# Patient Record
Sex: Male | Born: 1985 | Race: White | Hispanic: No | Smoking: Current every day smoker
Health system: Southern US, Community
[De-identification: ages and names within clinical notes are randomized; demographics above are authoritative.]

---

## 2015-08-05 ENCOUNTER — Emergency Department (HOSPITAL_COMMUNITY): Payer: Medicaid Other

## 2015-08-05 ENCOUNTER — Encounter (HOSPITAL_COMMUNITY): Payer: Self-pay | Admitting: Emergency Medicine

## 2015-08-05 ENCOUNTER — Inpatient Hospital Stay (HOSPITAL_COMMUNITY)
Admission: EM | Admit: 2015-08-05 | Discharge: 2015-09-05 | DRG: 082 | Disposition: E | Payer: Medicaid Other | Attending: General Surgery | Admitting: General Surgery

## 2015-08-05 DIAGNOSIS — J9600 Acute respiratory failure, unspecified whether with hypoxia or hypercapnia: Secondary | ICD-10-CM | POA: Diagnosis present

## 2015-08-05 DIAGNOSIS — Z66 Do not resuscitate: Secondary | ICD-10-CM | POA: Diagnosis not present

## 2015-08-05 DIAGNOSIS — R402312 Coma scale, best motor response, none, at arrival to emergency department: Secondary | ICD-10-CM | POA: Diagnosis present

## 2015-08-05 DIAGNOSIS — R402112 Coma scale, eyes open, never, at arrival to emergency department: Secondary | ICD-10-CM | POA: Diagnosis present

## 2015-08-05 DIAGNOSIS — X749XXA Intentional self-harm by unspecified firearm discharge, initial encounter: Secondary | ICD-10-CM | POA: Diagnosis not present

## 2015-08-05 DIAGNOSIS — Z515 Encounter for palliative care: Secondary | ICD-10-CM | POA: Diagnosis not present

## 2015-08-05 DIAGNOSIS — R402212 Coma scale, best verbal response, none, at arrival to emergency department: Secondary | ICD-10-CM | POA: Diagnosis present

## 2015-08-05 DIAGNOSIS — S069XAA Unspecified intracranial injury with loss of consciousness status unknown, initial encounter: Secondary | ICD-10-CM | POA: Diagnosis present

## 2015-08-05 DIAGNOSIS — W3400XA Accidental discharge from unspecified firearms or gun, initial encounter: Secondary | ICD-10-CM

## 2015-08-05 DIAGNOSIS — F17210 Nicotine dependence, cigarettes, uncomplicated: Secondary | ICD-10-CM | POA: Diagnosis present

## 2015-08-05 DIAGNOSIS — S069X9A Unspecified intracranial injury with loss of consciousness of unspecified duration, initial encounter: Principal | ICD-10-CM | POA: Diagnosis present

## 2015-08-05 DIAGNOSIS — Y249XXA Unspecified firearm discharge, undetermined intent, initial encounter: Secondary | ICD-10-CM

## 2015-08-05 DIAGNOSIS — R000 Tachycardia, unspecified: Secondary | ICD-10-CM | POA: Diagnosis present

## 2015-08-05 LAB — I-STAT ARTERIAL BLOOD GAS, ED
ACID-BASE DEFICIT: 5 mmol/L — AB (ref 0.0–2.0)
BICARBONATE: 22.3 meq/L (ref 20.0–24.0)
O2 SAT: 100 %
PH ART: 7.271 — AB (ref 7.350–7.450)
PO2 ART: 440 mmHg — AB (ref 80.0–100.0)
TCO2: 24 mmol/L (ref 0–100)
pCO2 arterial: 48.6 mmHg — ABNORMAL HIGH (ref 35.0–45.0)

## 2015-08-05 LAB — PREPARE FRESH FROZEN PLASMA
UNIT DIVISION: 0
Unit division: 0

## 2015-08-05 LAB — PROTIME-INR
INR: 1.17 (ref 0.00–1.49)
PROTHROMBIN TIME: 15.1 s (ref 11.6–15.2)

## 2015-08-05 LAB — COMPREHENSIVE METABOLIC PANEL
ALK PHOS: 68 U/L (ref 38–126)
ALT: 24 U/L (ref 17–63)
ANION GAP: 11 (ref 5–15)
AST: 41 U/L (ref 15–41)
Albumin: 3.6 g/dL (ref 3.5–5.0)
BILIRUBIN TOTAL: 0.4 mg/dL (ref 0.3–1.2)
BUN: 5 mg/dL — ABNORMAL LOW (ref 6–20)
CALCIUM: 7.8 mg/dL — AB (ref 8.9–10.3)
CO2: 21 mmol/L — AB (ref 22–32)
CREATININE: 0.93 mg/dL (ref 0.61–1.24)
Chloride: 103 mmol/L (ref 101–111)
GFR calc non Af Amer: >60 mL/min (ref >60)
GLUCOSE: 172 mg/dL — AB (ref 65–99)
Potassium: 3.5 mmol/L (ref 3.5–5.1)
Sodium: 135 mmol/L (ref 135–145)
TOTAL PROTEIN: 6.8 g/dL (ref 6.5–8.1)

## 2015-08-05 LAB — CBC
HCT: 41.7 % (ref 39.0–52.0)
Hemoglobin: 14.4 g/dL (ref 13.0–17.0)
MCH: 33 pg (ref 26.0–34.0)
MCHC: 34.5 g/dL (ref 30.0–36.0)
MCV: 95.6 fL (ref 78.0–100.0)
Platelets: 309 10*3/uL (ref 150–400)
RBC: 4.36 MIL/uL (ref 4.22–5.81)
RDW: 12.5 % (ref 11.5–15.5)
WBC: 13.2 10*3/uL — ABNORMAL HIGH (ref 4.0–10.5)

## 2015-08-05 LAB — LACTIC ACID, PLASMA: Lactic Acid, Venous: 3.5 mmol/L (ref 0.5–2.0)

## 2015-08-05 LAB — ETHANOL: ALCOHOL ETHYL (B): 198 mg/dL — AB (ref <5)

## 2015-08-05 LAB — CDS SEROLOGY

## 2015-08-05 LAB — ABO/RH: ABO/RH(D): A POS

## 2015-08-05 MED ORDER — ROCURONIUM BROMIDE 50 MG/5ML IV SOLN
INTRAVENOUS | Status: AC
  Filled 2015-08-05: qty 2

## 2015-08-05 MED ORDER — SODIUM CHLORIDE 0.9 % IV SOLN
INTRAVENOUS | Status: AC | PRN
  Administered 2015-08-05 (×2): 1000 mL via INTRAVENOUS

## 2015-08-05 MED ORDER — ANTISEPTIC ORAL RINSE SOLUTION (CORINZ): 7.0000 mL | Freq: Four times a day (QID) | OROMUCOSAL | Status: DC

## 2015-08-05 MED ORDER — POTASSIUM CHLORIDE IN NACL 20-0.9 MEQ/L-% IV SOLN
INTRAVENOUS | Status: DC
  Administered 2015-08-05: via INTRAVENOUS
  Filled 2015-08-05 (×3): qty 1000

## 2015-08-05 MED ORDER — ETOMIDATE 2 MG/ML IV SOLN
INTRAVENOUS | Status: AC
  Filled 2015-08-05: qty 20

## 2015-08-05 MED ORDER — LIDOCAINE HCL (CARDIAC) 20 MG/ML IV SOLN
INTRAVENOUS | Status: AC
  Filled 2015-08-05: qty 5

## 2015-08-05 MED ORDER — SUCCINYLCHOLINE CHLORIDE 20 MG/ML IJ SOLN
INTRAMUSCULAR | Status: AC
  Filled 2015-08-05: qty 1

## 2015-08-05 MED ORDER — CHLORHEXIDINE GLUCONATE 0.12% ORAL RINSE (MEDLINE KIT)
15.0000 mL | Freq: Two times a day (BID) | OROMUCOSAL | Status: DC
  Administered 2015-08-06: 15 mL via OROMUCOSAL

## 2015-08-05 NOTE — ED Notes (Signed)
Carollee HerterShannon from CDS [place] way [place].  Cell phone is [phone].
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
## 2015-08-05 NOTE — ED Notes (Signed)
Patient is on no sedation or pressers at this time.  Awaiting Neuro consult.

## 2015-08-05 NOTE — Progress Notes (Signed)
Called to patient beside by RN patient desat to 86%. FIO2 increased to 100%. RR increased to 20 per MD verbal order. RN at beside.

## 2015-08-05 NOTE — ED Notes (Signed)
Increased FiO2 back to 100 at this time.
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
## 2015-08-05 NOTE — ED Notes (Signed)
Patient returned from CT

## 2015-08-05 NOTE — ED Notes (Signed)
Shannon from [facility] arrived at bedside.
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
## 2015-08-05 NOTE — ED Notes (Signed)
Organ procurement team notified by Consulting civil engineerCharge RN.
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
## 2015-08-05 NOTE — ED Notes (Signed)
To CT with RN and EMT

## 2015-08-05 NOTE — Progress Notes (Signed)
RT assisted with intubation. Bilateral breath sounds present. CO2 dector has color change. CXR and ABG pending. RT will continue to monitor.

## 2015-08-05 NOTE — ED Notes (Signed)
Patient desaturated to high 80's and brady down to 50's.  Dr Gwendolyn GrantWalden, Dr Sibyl Parrhapman and Dr Janee Mornhompson notified.
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
## 2015-08-06 LAB — TYPE AND SCREEN
ABO/RH(D): A POS
Antibody Screen: NEGATIVE
UNIT DIVISION: 0
UNIT DIVISION: 0

## 2015-08-06 LAB — BLOOD PRODUCT ORDER (VERBAL) VERIFICATION

## 2015-08-06 LAB — BLOOD GAS, ARTERIAL
Acid-base deficit: 2.9 mmol/L — ABNORMAL HIGH (ref 0.0–2.0)
BICARBONATE: 20.4 meq/L (ref 20.0–24.0)
Drawn by: 129711
FIO2: 0.4
MECHVT: 620 mL
O2 Saturation: 95.7 %
PATIENT TEMPERATURE: 102.2
PCO2 ART: 32.4 mmHg — AB (ref 35.0–45.0)
PEEP/CPAP: 5 cmH2O
PO2 ART: 82.9 mmHg (ref 80.0–100.0)
RATE: 20 resp/min
TCO2: 21.3 mmol/L (ref 0–100)
pH, Arterial: 7.426 (ref 7.350–7.450)

## 2015-08-06 LAB — CBC
HEMATOCRIT: 41.9 % (ref 39.0–52.0)
HEMOGLOBIN: 14.3 g/dL (ref 13.0–17.0)
MCH: 32.8 pg (ref 26.0–34.0)
MCHC: 34.1 g/dL (ref 30.0–36.0)
MCV: 96.1 fL (ref 78.0–100.0)
Platelets: 284 10*3/uL (ref 150–400)
RBC: 4.36 MIL/uL (ref 4.22–5.81)
RDW: 12.8 % (ref 11.5–15.5)
WBC: 42.2 10*3/uL — ABNORMAL HIGH (ref 4.0–10.5)

## 2015-08-06 LAB — BASIC METABOLIC PANEL
ANION GAP: 17 — AB (ref 5–15)
BUN: <5 mg/dL — ABNORMAL LOW (ref 6–20)
CHLORIDE: 105 mmol/L (ref 101–111)
CO2: 17 mmol/L — AB (ref 22–32)
CREATININE: 0.87 mg/dL (ref 0.61–1.24)
Calcium: 8.1 mg/dL — ABNORMAL LOW (ref 8.9–10.3)
GFR calc non Af Amer: >60 mL/min (ref >60)
Glucose, Bld: 166 mg/dL — ABNORMAL HIGH (ref 65–99)
POTASSIUM: 3.4 mmol/L — AB (ref 3.5–5.1)
Sodium: 139 mmol/L (ref 135–145)

## 2015-08-06 LAB — MRSA PCR SCREENING: MRSA by PCR: NEGATIVE

## 2015-08-06 MED ORDER — ANTISEPTIC ORAL RINSE SOLUTION (CORINZ): 7.0000 mL | Freq: Four times a day (QID) | OROMUCOSAL | Status: DC

## 2015-08-06 MED ORDER — ACETAMINOPHEN 650 MG RE SUPP
650.0000 mg | RECTAL | Status: DC | PRN
  Filled 2015-08-06: qty 1

## 2015-08-06 MED ORDER — ANTISEPTIC ORAL RINSE SOLUTION (CORINZ): 7.0000 mL | OROMUCOSAL | Status: DC

## 2015-08-06 MED ORDER — ANTISEPTIC ORAL RINSE SOLUTION (CORINZ)
7.0000 mL | OROMUCOSAL | Status: DC
  Administered 2015-08-06 (×3): 7 mL via OROMUCOSAL

## 2015-08-06 MED ORDER — CHLORHEXIDINE GLUCONATE 0.12% ORAL RINSE (MEDLINE KIT): 15.0000 mL | Freq: Two times a day (BID) | OROMUCOSAL | Status: DC

## 2015-08-06 MED ORDER — DEXTROSE 5 % IV SOLN: INTRAVENOUS | Status: DC

## 2015-08-06 MED ORDER — ALBUMIN HUMAN 5 % IV SOLN
INTRAVENOUS | Status: AC
  Filled 2015-08-06: qty 500

## 2015-08-06 MED ORDER — MORPHINE SULFATE 25 MG/ML IV SOLN
10.0000 mg/h | INTRAVENOUS | Status: DC
  Administered 2015-08-06 (×2): 10 mg/h via INTRAVENOUS
  Filled 2015-08-06: qty 10

## 2015-08-06 MED ORDER — MORPHINE SULFATE (PF) 4 MG/ML IV SOLN
INTRAVENOUS | Status: AC
  Administered 2015-08-06: 4 mg
  Filled 2015-08-06: qty 1

## 2015-08-06 MED ORDER — ALBUMIN HUMAN 5 % IV SOLN
INTRAVENOUS | Status: AC
  Filled 2015-08-06: qty 250

## 2015-08-06 MED ORDER — CHLORHEXIDINE GLUCONATE 0.12% ORAL RINSE (MEDLINE KIT)
15.0000 mL | Freq: Two times a day (BID) | OROMUCOSAL | Status: DC
  Administered 2015-08-06: 15 mL via OROMUCOSAL

## 2015-08-06 MED ORDER — ALBUMIN HUMAN 5 % IV SOLN: 25.0000 g | Freq: Once | INTRAVENOUS | Status: DC

## 2015-08-06 MED ORDER — ALBUMIN HUMAN 5 % IV SOLN
12.5000 g | Freq: Once | INTRAVENOUS | Status: AC
  Administered 2015-08-06: 12.5 g via INTRAVENOUS

## 2015-08-06 MED ORDER — SODIUM CHLORIDE 0.9 % IV SOLN: 500.0000 mL | Freq: Once | INTRAVENOUS | Status: DC

## 2015-08-06 MED ORDER — MORPHINE BOLUS VIA INFUSION
5.0000 mg | INTRAVENOUS | Status: DC | PRN
  Filled 2015-08-06: qty 20

## 2015-08-06 DEATH — deceased

## 2015-08-23 DIAGNOSIS — S069X9A Unspecified intracranial injury with loss of consciousness of unspecified duration, initial encounter: Secondary | ICD-10-CM | POA: Diagnosis present

## 2015-08-23 DIAGNOSIS — J9600 Acute respiratory failure, unspecified whether with hypoxia or hypercapnia: Secondary | ICD-10-CM | POA: Diagnosis present

## 2015-08-23 DIAGNOSIS — S069XAA Unspecified intracranial injury with loss of consciousness status unknown, initial encounter: Secondary | ICD-10-CM | POA: Diagnosis present

## 2015-09-05 NOTE — Progress Notes (Signed)
Chaplain stopped by [facility] just before 8:30 shift change for update and to see whether pt's mom had arrived from [place]. Pt's mom was at bedside, as were pt's dad and stepmom. Had separate conversations with pt's mom and dad, offering words of comfort and condolence. Mom is tearful but calm. Mom and dad each seem to be in an appropriate place considering what has happened. Pt's mom said pt had been battling depression several years. Pt's mom asked me to pray with her and I did. I offered to both mom and dad to have [facility] chaplain come see them and they agreed. After leaving pt room I updated RN and CDS representatives on my visit with parents, stating that parents realize that pt will not survive.

## 2015-09-05 NOTE — Progress Notes (Signed)
   August 16, 2015 1248  Clinical Encounter Type  Visited With Patient and family together;Health care provider  Visit Type Initial;Patient actively dying  Referral From Nurse  Spiritual Encounters  Spiritual Needs Prayer;Grief support  Stress Factors  Family Stress Factors Loss   Chaplain responded to an end of life consult for the patient. Chaplain met with family and offered prayer upon their request. Chaplain support is available as needed.   Jeri Lager, Chaplain 08/16/2015 12:49 PM

## 2015-09-05 NOTE — Progress Notes (Signed)
Brief examination of the patient this AM shows no over-breathing of the ventilator, no corneal reflexes, no pupillary response of the 7mm pupils bilaterally, and no gag reflex.  ABG is pending.  I have spoken with the family, and it seems as thought he patient is moving towards brain death.  Hemoglobin is stable.  [place] [facility] Services are present.  Marta LamasJames O. Gae BonWyatt, III, MD, FACS [phone] Trauma Surgeon

## 2015-09-05 NOTE — Progress Notes (Signed)
Nutrition Brief Note  Chart reviewed. Pt discussed during ICU rounds and with RN.  Per team plan is to withdraw care today.  No nutrition interventions warranted at this time.  Please re-consult as needed.   Kendell BaneHeather Lucca Greggs RD, LDN, CNSC [phone] Pager [phone] After Hours Pager

## 2015-09-05 NOTE — Progress Notes (Signed)
RT note- Patient was extubated per withdrawal orders and family's wishes.
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
## 2015-09-05 NOTE — Progress Notes (Signed)
Wasted 210 ml of morphine (1mg /ml) in sink.  Aviva SignsJessica Dunn witnessed waste. Lessa Huge C January 02, 2015 3:11 PM

## 2015-09-05 NOTE — Clinical Social Work Note (Signed)
Clinical Social Worker received standing order referral for comfort care.  Patient family has opted to withdraw care and does not want to consider organ donation.  CSW spoke with RN who states that family is understanding and appropriately coping with current situation.  CSW available if needed, otherwise will sign off.  Please notify if new needs arise.  Macario GoldsJesse Maryanna Stuber, [place] [phone]

## 2015-09-05 NOTE — Progress Notes (Signed)
The family has decided not to consider organ donation.  They want to withdraw support as soon as possible.  They do not want to speak with the service.  Marta LamasJames O. Gae BonWyatt, III, MD, FACS [phone] Trauma Surgeon
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
## 2015-09-05 DEATH — deceased
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
